# Patient Record
Sex: Female | Born: 1990 | Race: White | Hispanic: No | Smoking: Current every day smoker
Health system: Southern US, Community
[De-identification: ages and names within clinical notes are randomized; demographics above are authoritative.]

## PROBLEM LIST (undated history)

## (undated) DIAGNOSIS — I10 Essential (primary) hypertension: Secondary | ICD-10-CM

## (undated) DIAGNOSIS — O009 Unspecified ectopic pregnancy without intrauterine pregnancy: Secondary | ICD-10-CM

## (undated) DIAGNOSIS — R51 Headache: Secondary | ICD-10-CM

## (undated) DIAGNOSIS — Z9889 Other specified postprocedural states: Secondary | ICD-10-CM

## (undated) DIAGNOSIS — R519 Headache, unspecified: Secondary | ICD-10-CM

## (undated) HISTORY — PX: MYRINGOTOMY WITH TUBE PLACEMENT: SHX5663

## (undated) HISTORY — PX: TONSILLECTOMY: SUR1361

---

## 2015-12-22 ENCOUNTER — Other Ambulatory Visit (HOSPITAL_COMMUNITY): Payer: Self-pay | Admitting: Obstetrics and Gynecology

## 2015-12-22 DIAGNOSIS — Z3689 Encounter for other specified antenatal screening: Secondary | ICD-10-CM

## 2015-12-22 DIAGNOSIS — Z3A28 28 weeks gestation of pregnancy: Secondary | ICD-10-CM

## 2015-12-22 DIAGNOSIS — O365990 Maternal care for other known or suspected poor fetal growth, unspecified trimester, not applicable or unspecified: Secondary | ICD-10-CM

## 2015-12-27 ENCOUNTER — Other Ambulatory Visit (HOSPITAL_COMMUNITY): Payer: Self-pay | Admitting: *Deleted

## 2015-12-27 ENCOUNTER — Other Ambulatory Visit (HOSPITAL_COMMUNITY): Payer: Self-pay | Admitting: Obstetrics and Gynecology

## 2015-12-27 ENCOUNTER — Ambulatory Visit (HOSPITAL_COMMUNITY)
Admission: RE | Admit: 2015-12-27 | Discharge: 2015-12-27 | Disposition: A | Discharge status: Home / Self Care | Payer: Medicaid Other | Source: Ambulatory Visit | Attending: Obstetrics and Gynecology | Admitting: Obstetrics and Gynecology

## 2015-12-27 ENCOUNTER — Encounter (HOSPITAL_COMMUNITY): Payer: Self-pay | Admitting: *Deleted

## 2015-12-27 DIAGNOSIS — O09299 Supervision of pregnancy with other poor reproductive or obstetric history, unspecified trimester: Secondary | ICD-10-CM

## 2015-12-27 DIAGNOSIS — O99333 Smoking (tobacco) complicating pregnancy, third trimester: Secondary | ICD-10-CM

## 2015-12-27 DIAGNOSIS — Z3A28 28 weeks gestation of pregnancy: Secondary | ICD-10-CM | POA: Diagnosis not present

## 2015-12-27 DIAGNOSIS — O34219 Maternal care for unspecified type scar from previous cesarean delivery: Secondary | ICD-10-CM

## 2015-12-27 DIAGNOSIS — O34211 Maternal care for low transverse scar from previous cesarean delivery: Secondary | ICD-10-CM | POA: Diagnosis not present

## 2015-12-27 DIAGNOSIS — O26843 Uterine size-date discrepancy, third trimester: Secondary | ICD-10-CM

## 2015-12-27 DIAGNOSIS — O99323 Drug use complicating pregnancy, third trimester: Secondary | ICD-10-CM | POA: Insufficient documentation

## 2015-12-27 DIAGNOSIS — Z363 Encounter for antenatal screening for malformations: Secondary | ICD-10-CM | POA: Diagnosis not present

## 2015-12-27 DIAGNOSIS — O99330 Smoking (tobacco) complicating pregnancy, unspecified trimester: Secondary | ICD-10-CM | POA: Insufficient documentation

## 2015-12-27 DIAGNOSIS — F1490 Cocaine use, unspecified, uncomplicated: Secondary | ICD-10-CM

## 2015-12-27 DIAGNOSIS — O365990 Maternal care for other known or suspected poor fetal growth, unspecified trimester, not applicable or unspecified: Secondary | ICD-10-CM

## 2015-12-27 DIAGNOSIS — O99320 Drug use complicating pregnancy, unspecified trimester: Secondary | ICD-10-CM | POA: Insufficient documentation

## 2015-12-27 DIAGNOSIS — O09213 Supervision of pregnancy with history of pre-term labor, third trimester: Secondary | ICD-10-CM | POA: Insufficient documentation

## 2015-12-27 DIAGNOSIS — Z3689 Encounter for other specified antenatal screening: Secondary | ICD-10-CM

## 2015-12-27 DIAGNOSIS — F1410 Cocaine abuse, uncomplicated: Secondary | ICD-10-CM

## 2015-12-27 DIAGNOSIS — O358XX0 Maternal care for other (suspected) fetal abnormality and damage, not applicable or unspecified: Secondary | ICD-10-CM | POA: Insufficient documentation

## 2015-12-27 HISTORY — DX: Unspecified ectopic pregnancy without intrauterine pregnancy: O00.90

## 2015-12-27 HISTORY — DX: Headache: R51

## 2015-12-27 HISTORY — DX: Other specified postprocedural states: Z98.890

## 2015-12-27 HISTORY — DX: Headache, unspecified: R51.9

## 2015-12-27 HISTORY — DX: Essential (primary) hypertension: I10

## 2015-12-27 NOTE — ED Notes (Signed)
Pt had bright red vaginal bleeding and passing clots yesterday morning, [facility].  No bleeding today.

## 2015-12-27 NOTE — Progress Notes (Signed)
Maternal Fetal Medicine Consultation  Requesting Provider(s):Gaccione  Primary OB:Gaccione Reason for consultation: tobacco and cocaine uWU:[id number], suspected abnormal flow in umbilical artery  HPI: 25 yo P0132 with tobacco, cocaine use during pregnancy, seen in hospital with bleeding, an US showed absent flow in fetal umbilical artery. Some suspicion of previa was noted as well. Currently not bleeding. Has decreased tobacco use to 4-5 cigarettes per day, and is on nicotine patch. Cocaine use is described as sporadic OB History: OB History    Gravida Para Term Preterm AB Living   5 1   1 3 2    SAB TAB Ectopic Multiple Live Births     2 1 1         PMH:  Past Medical History:  Diagnosis Date  . Ectopic pregnancy   . H/O laparoscopy   . Headache   . Hypertension     PSH:  Past Surgical History:  Procedure Laterality Date  . CESAREAN SECTION    . MYRINGOTOMY WITH TUBE PLACEMENT    . TONSILLECTOMY     Meds: See EPIC section Allergies: NKDA FH:See EPIC section Soc: See EPIC section and HPI  Review of Systems: no vaginal bleeding or cramping/contractions, no LOF, no nausea/vomiting. All other systems reviewed and are negative.  PE: See EPIC section  Please see separate document for fetal ultrasound report.  A/P: Tobacco and cocaine use during pregnancy. No evidence of IUGR or abnormal umbilical artery blood flow or placenta previa  I had a long discussion with the patient involving her cocaine and tobacco use and the possible consequences for herself and the baby, including stillbirth and growth restriction. I applauded her efforts to quit smoking and encouraged her to discontinue cigarette use altogether while on the nicotine patch. I also encourged her to discontinue cocaine use entirely, which she said she would do.  Our ultrasound showed no placenta previa. Her placenta is anterior and well clear of the internal os. The blood flow through both umbilical arteries was pulsatile as  expected, and normal.   The growth does not meet criteria for IUGR, but I am concerned over the low percentiles we obtained for all measurements except the abdomen. This mirrors the finding from your US on 12/21/15.  I have asked her to return for another US in 2 weeks so we can reassess the findings from today's scan and check for interval growth  Thank you for the opportunity to be a part of the care of Carly Evans. Please contact our office if we can be of further assistance.   I spent approximately 30 minutes with this patient with over 50% of time spent in face-to-face counseling.

## 2015-12-30 ENCOUNTER — Encounter (HOSPITAL_COMMUNITY): Payer: Self-pay

## 2016-01-10 ENCOUNTER — Encounter (HOSPITAL_COMMUNITY): Payer: Self-pay

## 2016-01-10 ENCOUNTER — Other Ambulatory Visit (HOSPITAL_COMMUNITY): Payer: Self-pay | Admitting: Obstetrics and Gynecology

## 2016-01-10 ENCOUNTER — Ambulatory Visit (HOSPITAL_COMMUNITY)
Admission: RE | Admit: 2016-01-10 | Discharge: 2016-01-10 | Disposition: A | Discharge status: Home / Self Care | Payer: Medicaid Other | Source: Ambulatory Visit | Attending: Obstetrics and Gynecology | Admitting: Obstetrics and Gynecology

## 2016-01-10 DIAGNOSIS — O99323 Drug use complicating pregnancy, third trimester: Secondary | ICD-10-CM

## 2016-01-10 DIAGNOSIS — Z3A30 30 weeks gestation of pregnancy: Secondary | ICD-10-CM | POA: Insufficient documentation

## 2016-01-10 DIAGNOSIS — O26843 Uterine size-date discrepancy, third trimester: Secondary | ICD-10-CM | POA: Insufficient documentation

## 2016-01-10 DIAGNOSIS — O09213 Supervision of pregnancy with history of pre-term labor, third trimester: Secondary | ICD-10-CM

## 2016-01-10 DIAGNOSIS — O09293 Supervision of pregnancy with other poor reproductive or obstetric history, third trimester: Secondary | ICD-10-CM | POA: Diagnosis not present

## 2016-01-10 DIAGNOSIS — O34211 Maternal care for low transverse scar from previous cesarean delivery: Secondary | ICD-10-CM | POA: Insufficient documentation

## 2016-01-11 ENCOUNTER — Other Ambulatory Visit (HOSPITAL_COMMUNITY): Payer: Self-pay | Admitting: *Deleted

## 2016-01-11 DIAGNOSIS — O4103X1 Oligohydramnios, third trimester, fetus 1: Secondary | ICD-10-CM

## 2016-01-17 ENCOUNTER — Ambulatory Visit (HOSPITAL_COMMUNITY)
Admission: RE | Admit: 2016-01-17 | Discharge: 2016-01-17 | Disposition: A | Discharge status: Home / Self Care | Payer: Medicaid Other | Source: Ambulatory Visit | Attending: Obstetrics and Gynecology | Admitting: Obstetrics and Gynecology

## 2016-01-17 ENCOUNTER — Other Ambulatory Visit (HOSPITAL_COMMUNITY): Payer: Self-pay | Admitting: Obstetrics and Gynecology

## 2016-01-17 ENCOUNTER — Encounter (HOSPITAL_COMMUNITY): Payer: Self-pay

## 2016-01-17 DIAGNOSIS — O4103X0 Oligohydramnios, third trimester, not applicable or unspecified: Secondary | ICD-10-CM | POA: Insufficient documentation

## 2016-01-17 DIAGNOSIS — O99323 Drug use complicating pregnancy, third trimester: Secondary | ICD-10-CM | POA: Diagnosis not present

## 2016-01-17 DIAGNOSIS — O4103X1 Oligohydramnios, third trimester, fetus 1: Secondary | ICD-10-CM

## 2016-01-17 DIAGNOSIS — Z3A31 31 weeks gestation of pregnancy: Secondary | ICD-10-CM

## 2016-01-17 DIAGNOSIS — O26843 Uterine size-date discrepancy, third trimester: Secondary | ICD-10-CM | POA: Diagnosis not present

## 2016-01-17 DIAGNOSIS — O09299 Supervision of pregnancy with other poor reproductive or obstetric history, unspecified trimester: Secondary | ICD-10-CM

## 2016-01-17 DIAGNOSIS — O09213 Supervision of pregnancy with history of pre-term labor, third trimester: Secondary | ICD-10-CM | POA: Diagnosis not present

## 2016-01-17 DIAGNOSIS — O34211 Maternal care for low transverse scar from previous cesarean delivery: Secondary | ICD-10-CM | POA: Diagnosis not present

## 2016-01-23 DIAGNOSIS — O4403 Placenta previa specified as without hemorrhage, third trimester: Secondary | ICD-10-CM

## 2016-01-23 DIAGNOSIS — O99320 Drug use complicating pregnancy, unspecified trimester: Secondary | ICD-10-CM

## 2016-01-23 DIAGNOSIS — O4413 Placenta previa with hemorrhage, third trimester: Secondary | ICD-10-CM

## 2016-01-23 DIAGNOSIS — D72829 Elevated white blood cell count, unspecified: Secondary | ICD-10-CM

## 2016-01-23 DIAGNOSIS — D62 Acute posthemorrhagic anemia: Secondary | ICD-10-CM

## 2016-01-23 DIAGNOSIS — Z9911 Dependence on respirator [ventilator] status: Secondary | ICD-10-CM | POA: Diagnosis not present

## 2016-01-23 DIAGNOSIS — E872 Acidosis: Secondary | ICD-10-CM | POA: Diagnosis not present

## 2016-01-24 DIAGNOSIS — N19 Unspecified kidney failure: Secondary | ICD-10-CM | POA: Diagnosis not present

## 2016-01-24 DIAGNOSIS — D62 Acute posthemorrhagic anemia: Secondary | ICD-10-CM

## 2016-01-24 DIAGNOSIS — E875 Hyperkalemia: Secondary | ICD-10-CM | POA: Diagnosis not present

## 2016-01-24 DIAGNOSIS — D72829 Elevated white blood cell count, unspecified: Secondary | ICD-10-CM | POA: Diagnosis not present

## 2016-01-24 DIAGNOSIS — O4413 Placenta previa with hemorrhage, third trimester: Secondary | ICD-10-CM | POA: Diagnosis not present

## 2016-01-24 DIAGNOSIS — O4403 Placenta previa specified as without hemorrhage, third trimester: Secondary | ICD-10-CM | POA: Diagnosis not present

## 2016-01-24 DIAGNOSIS — E872 Acidosis: Secondary | ICD-10-CM | POA: Diagnosis not present

## 2016-01-24 DIAGNOSIS — O99320 Drug use complicating pregnancy, unspecified trimester: Secondary | ICD-10-CM | POA: Diagnosis not present

## 2016-01-24 DIAGNOSIS — Z9911 Dependence on respirator [ventilator] status: Secondary | ICD-10-CM | POA: Diagnosis not present

## 2016-01-25 DIAGNOSIS — D72829 Elevated white blood cell count, unspecified: Secondary | ICD-10-CM

## 2016-01-25 DIAGNOSIS — O99320 Drug use complicating pregnancy, unspecified trimester: Secondary | ICD-10-CM

## 2016-01-25 DIAGNOSIS — O4413 Placenta previa with hemorrhage, third trimester: Secondary | ICD-10-CM

## 2016-01-25 DIAGNOSIS — N19 Unspecified kidney failure: Secondary | ICD-10-CM

## 2016-01-25 DIAGNOSIS — E872 Acidosis: Secondary | ICD-10-CM

## 2016-01-25 DIAGNOSIS — E875 Hyperkalemia: Secondary | ICD-10-CM

## 2016-01-25 DIAGNOSIS — Z9911 Dependence on respirator [ventilator] status: Secondary | ICD-10-CM

## 2016-01-25 DIAGNOSIS — D62 Acute posthemorrhagic anemia: Secondary | ICD-10-CM | POA: Diagnosis not present

## 2016-01-26 DIAGNOSIS — Z9911 Dependence on respirator [ventilator] status: Secondary | ICD-10-CM | POA: Diagnosis not present

## 2016-01-26 DIAGNOSIS — E875 Hyperkalemia: Secondary | ICD-10-CM | POA: Diagnosis not present

## 2016-01-26 DIAGNOSIS — D62 Acute posthemorrhagic anemia: Secondary | ICD-10-CM | POA: Diagnosis not present

## 2016-01-27 DIAGNOSIS — Z9911 Dependence on respirator [ventilator] status: Secondary | ICD-10-CM | POA: Diagnosis not present

## 2016-01-27 DIAGNOSIS — D62 Acute posthemorrhagic anemia: Secondary | ICD-10-CM | POA: Diagnosis not present

## 2016-01-27 DIAGNOSIS — E875 Hyperkalemia: Secondary | ICD-10-CM | POA: Diagnosis not present

## 2016-01-28 DIAGNOSIS — Z9911 Dependence on respirator [ventilator] status: Secondary | ICD-10-CM | POA: Diagnosis not present

## 2016-01-28 DIAGNOSIS — E875 Hyperkalemia: Secondary | ICD-10-CM | POA: Diagnosis not present

## 2016-01-28 DIAGNOSIS — D62 Acute posthemorrhagic anemia: Secondary | ICD-10-CM | POA: Diagnosis not present

## 2016-01-31 ENCOUNTER — Ambulatory Visit (HOSPITAL_COMMUNITY): Admission: RE | Admit: 2016-01-31 | Payer: Medicaid Other | Source: Ambulatory Visit

## 2016-10-31 ENCOUNTER — Encounter (HOSPITAL_COMMUNITY): Payer: Self-pay

## 2017-07-16 IMAGING — US US MFM OB LIMITED
1 series · 14 of 22 positions shown · non-contrast
Comparison: none

[Series 1: us mfm ob limited · 22 acquisitions, 14 frames shown]
[im 1/22]
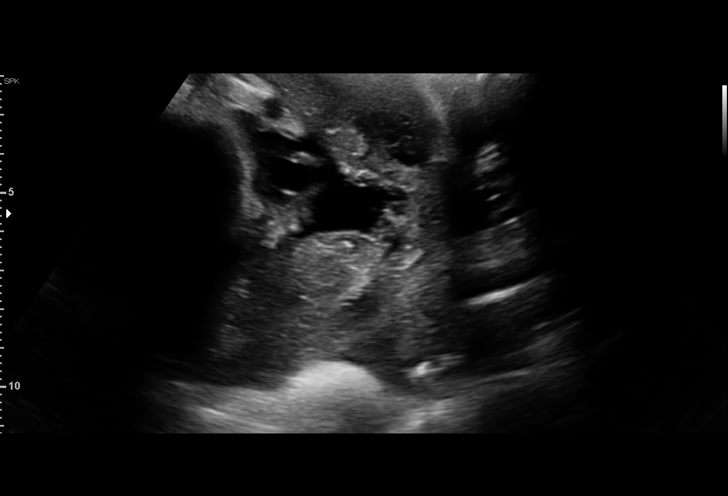
[im 3/22]
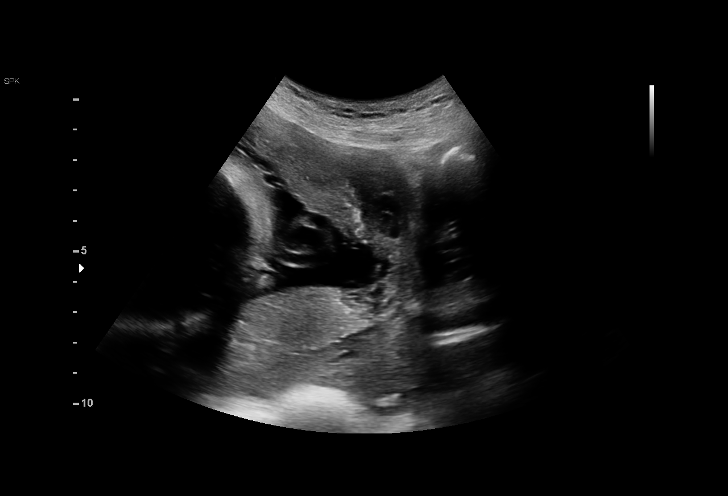
[im 4/22]
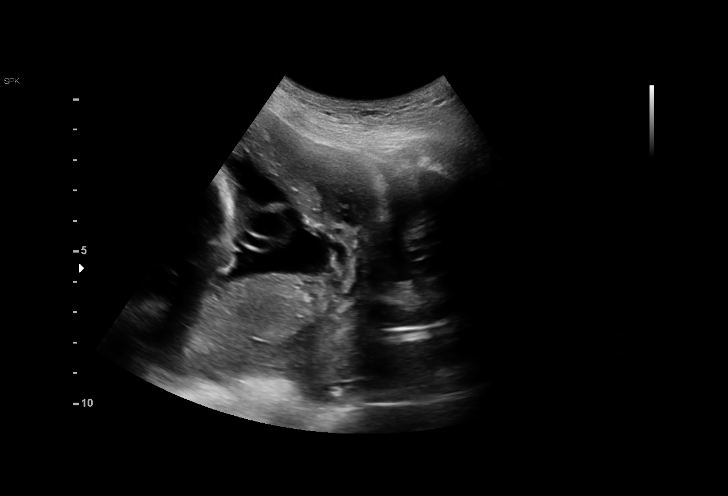
[im 6/22]
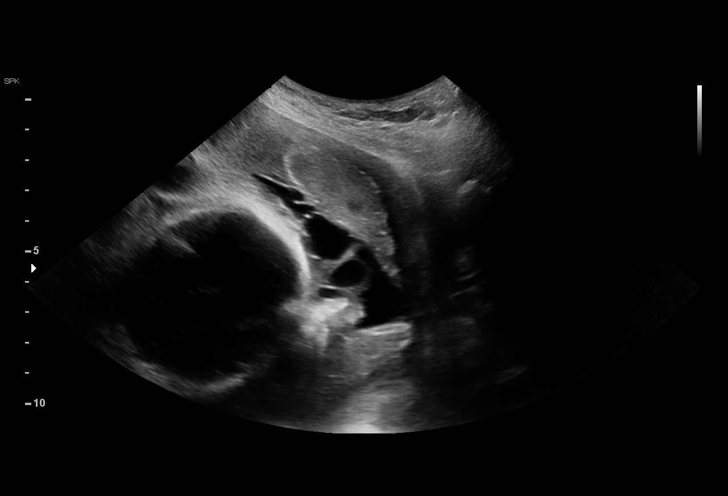
[im 8/22]
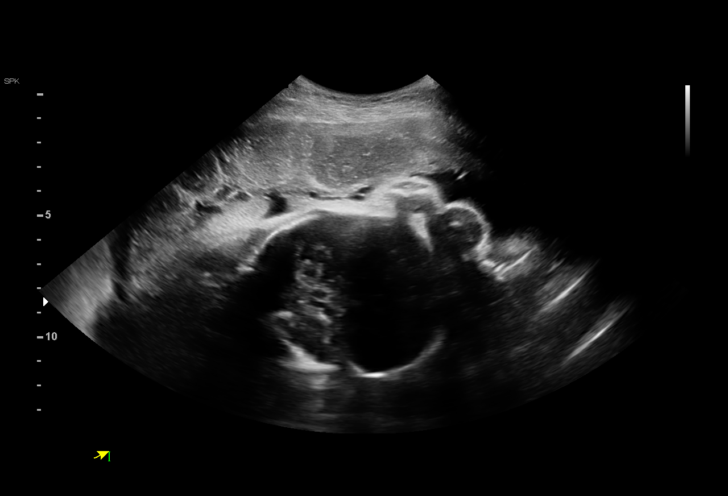
[im 9/22]
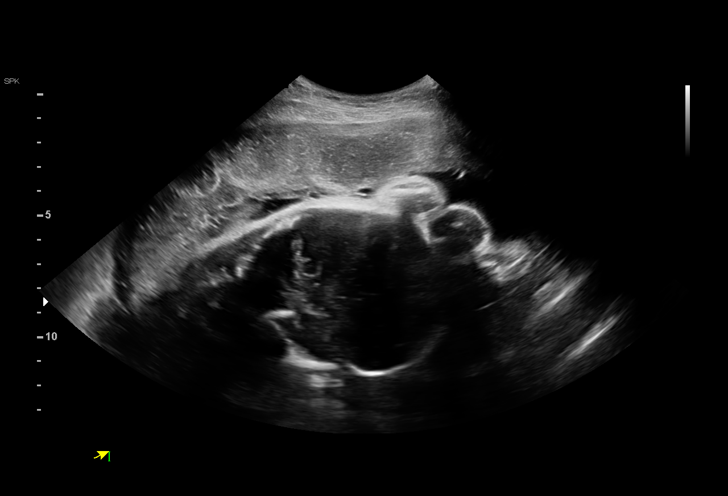
[im 11/22]
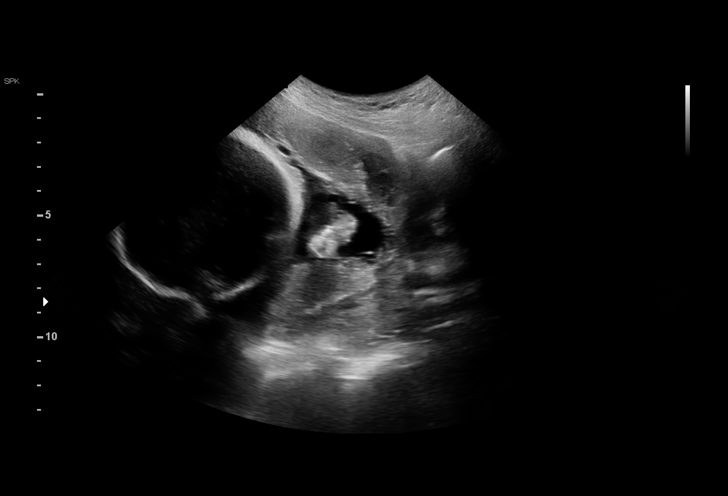
[im 12/22]
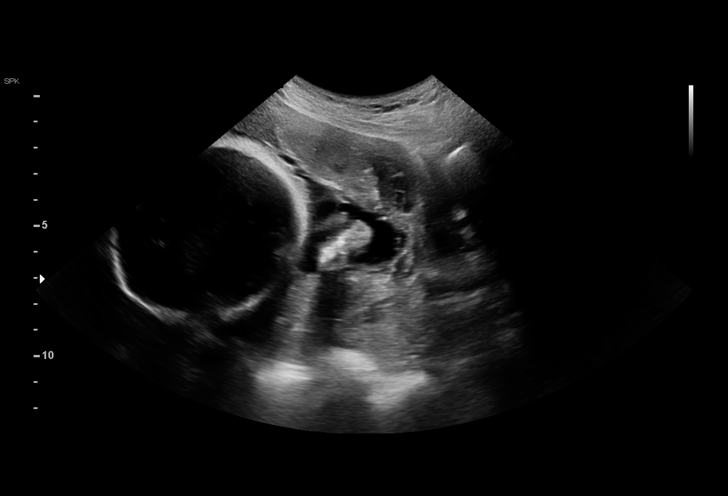
[im 14/22]
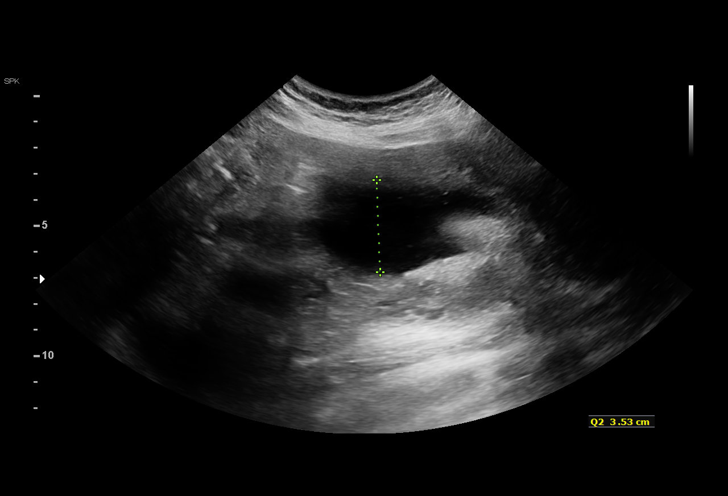
[im 15/22]
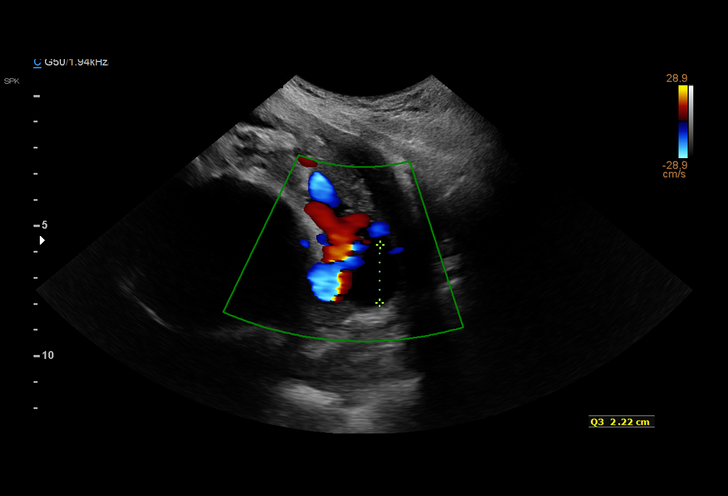
[im 17/22]
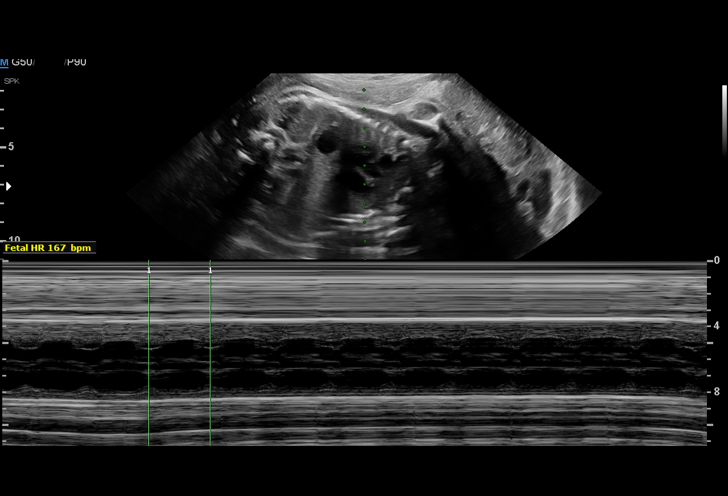
[im 19/22]
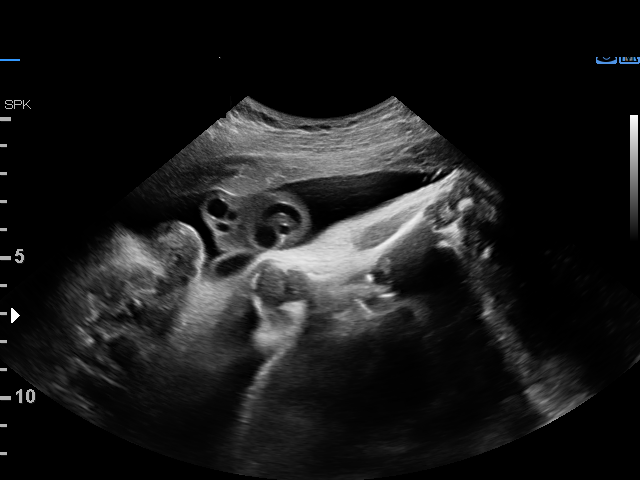
[im 20/22]
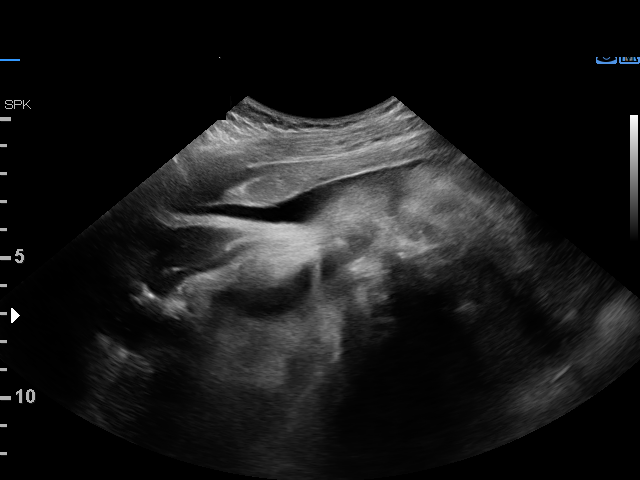
[im 22/22]
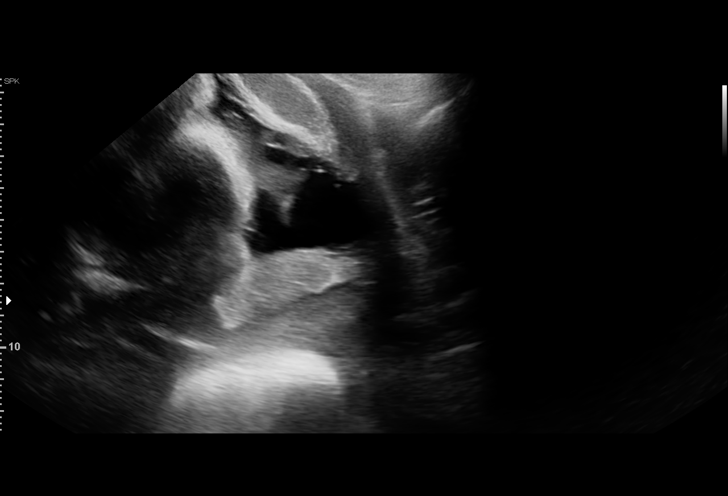

[14 of 22 positions shown; findings below may reference images not displayed]

Indications

31 weeks gestation of pregnancy
Drug use complicating pregnancy, third
trimester
Previous cesarean delivery, antepartum
Uterine size-date discrepancy, third trimester
Poor obstetric history: Previous preterm
delivery (28 wk twins, preeclampsia)
Decreased amniotic fluid volume
OB History

Gravidity:    5         Term:   0        Prem:   1        SAB:   0
TOP:          2       Ectopic:  1        Living: 2
Fetal Evaluation

Num Of Fetuses:     1
Fetal Heart         167
Rate(bpm):
Cardiac Activity:   Observed
Presentation:       Vertex
Placenta:           Posterior Previa

Amniotic Fluid
AFI FV:      Subjectively within normal limits

AFI Sum(cm)     %Tile       Largest Pocket(cm)
12.04           32

RUQ(cm)       RLQ(cm)       LUQ(cm)        LLQ(cm)
4.81
Gestational Age

Best:          31w 5d    Det. By:   Early Ultrasound         EDD:   03/15/16
(11/10/15)
Anatomy

Cranium:               Previously seen        Aortic Arch:            Previously seen
Cavum:                 Previously seen        Ductal Arch:            Previously seen
Ventricles:            Previously seen        Diaphragm:              Previously seen
Choroid Plexus:        Previously seen        Stomach:                Appears normal, left
sided
Cerebellum:            Previously seen        Abdomen:                Previously seen
Posterior Fossa:       Previously seen        Abdominal Wall:         Not well visualized
Nuchal Fold:           Not applicable (>20    Cord Vessels:           Previously seen
wks GA)
Face:                  Orbits and profile     Kidneys:                Previously seen
previously seen
Lips:                  Previously seen        Bladder:                Previously seen
Thoracic:              Previously seen        Spine:                  Not well visualized
Heart:                 Appears normal         Upper Extremities:      Previously seen
(4CH, axis, and
situs)
RVOT:                  Previously seen        Lower Extremities:      Previously seen
LVOT:                  Previously seen

Other:  Fetus appears to be a male. Heels and right 5th digit visualized.
Nasal bone visualized. Technically difficult due to fetal position.
Impression

SIUP at 31+5 weeks
Normal amniotic fluid volume
Posterior previa
Recommendations

Follow-up ultrasound in on 01/31/16 weeks to reassess
placental location and growth
